# Patient Record
Sex: Female | Born: 1995 | Race: White | Hispanic: No | Marital: Single | Smoking: Never smoker
Health system: Southern US, Community
[De-identification: ages and names within clinical notes are randomized; demographics above are authoritative.]

## PROBLEM LIST (undated history)

## (undated) DIAGNOSIS — N83209 Unspecified ovarian cyst, unspecified side: Secondary | ICD-10-CM

---

## 2017-01-28 ENCOUNTER — Emergency Department (HOSPITAL_BASED_OUTPATIENT_CLINIC_OR_DEPARTMENT_OTHER)
Admission: EM | Admit: 2017-01-28 | Discharge: 2017-01-29 | Disposition: A | Discharge status: Home / Self Care | Payer: BLUE CROSS/BLUE SHIELD | Attending: Emergency Medicine | Admitting: Emergency Medicine

## 2017-01-28 ENCOUNTER — Emergency Department (HOSPITAL_BASED_OUTPATIENT_CLINIC_OR_DEPARTMENT_OTHER): Payer: BLUE CROSS/BLUE SHIELD

## 2017-01-28 ENCOUNTER — Encounter (HOSPITAL_BASED_OUTPATIENT_CLINIC_OR_DEPARTMENT_OTHER): Payer: Self-pay

## 2017-01-28 DIAGNOSIS — R1032 Left lower quadrant pain: Secondary | ICD-10-CM | POA: Diagnosis present

## 2017-01-28 DIAGNOSIS — K5909 Other constipation: Secondary | ICD-10-CM | POA: Diagnosis not present

## 2017-01-28 DIAGNOSIS — N760 Acute vaginitis: Secondary | ICD-10-CM

## 2017-01-28 DIAGNOSIS — B9689 Other specified bacterial agents as the cause of diseases classified elsewhere: Secondary | ICD-10-CM

## 2017-01-28 DIAGNOSIS — Z202 Contact with and (suspected) exposure to infections with a predominantly sexual mode of transmission: Secondary | ICD-10-CM | POA: Insufficient documentation

## 2017-01-28 DIAGNOSIS — Z711 Person with feared health complaint in whom no diagnosis is made: Secondary | ICD-10-CM

## 2017-01-28 DIAGNOSIS — R110 Nausea: Secondary | ICD-10-CM | POA: Diagnosis not present

## 2017-01-28 HISTORY — DX: Unspecified ovarian cyst, unspecified side: N83.209

## 2017-01-28 LAB — URINALYSIS, ROUTINE W REFLEX MICROSCOPIC
Bilirubin Urine: NEGATIVE
GLUCOSE, UA: NEGATIVE mg/dL
Ketones, ur: NEGATIVE mg/dL
LEUKOCYTES UA: NEGATIVE
Nitrite: NEGATIVE
PH: 7 (ref 5.0–8.0)
Protein, ur: NEGATIVE mg/dL
SPECIFIC GRAVITY, URINE: 1.006 (ref 1.005–1.030)

## 2017-01-28 LAB — CBC WITH DIFFERENTIAL/PLATELET
BASOS ABS: 0 10*3/uL (ref 0.0–0.1)
Basophils Relative: 0 %
EOS PCT: 2 %
Eosinophils Absolute: 0.2 10*3/uL (ref 0.0–0.7)
HEMATOCRIT: 37.3 % (ref 36.0–46.0)
Hemoglobin: 12.7 g/dL (ref 12.0–15.0)
Lymphocytes Relative: 44 %
Lymphs Abs: 3.5 10*3/uL (ref 0.7–4.0)
MCH: 31.2 pg (ref 26.0–34.0)
MCHC: 34 g/dL (ref 30.0–36.0)
MCV: 91.6 fL (ref 78.0–100.0)
Monocytes Absolute: 0.6 10*3/uL (ref 0.1–1.0)
Monocytes Relative: 7 %
NEUTROS ABS: 3.7 10*3/uL (ref 1.7–7.7)
Neutrophils Relative %: 47 %
Platelets: 325 10*3/uL (ref 150–400)
RBC: 4.07 MIL/uL (ref 3.87–5.11)
RDW: 12.3 % (ref 11.5–15.5)
WBC: 7.9 10*3/uL (ref 4.0–10.5)

## 2017-01-28 LAB — BASIC METABOLIC PANEL
Anion gap: 7 (ref 5–15)
BUN: 10 mg/dL (ref 6–20)
CALCIUM: 9.4 mg/dL (ref 8.9–10.3)
CO2: 26 mmol/L (ref 22–32)
Chloride: 105 mmol/L (ref 101–111)
Creatinine, Ser: 0.75 mg/dL (ref 0.44–1.00)
Glucose, Bld: 120 mg/dL — ABNORMAL HIGH (ref 65–99)
POTASSIUM: 3.4 mmol/L — AB (ref 3.5–5.1)
Sodium: 138 mmol/L (ref 135–145)

## 2017-01-28 LAB — URINALYSIS, MICROSCOPIC (REFLEX): WBC, UA: NONE SEEN WBC/hpf (ref 0–5)

## 2017-01-28 LAB — PREGNANCY, URINE: PREG TEST UR: NEGATIVE

## 2017-01-28 MED ORDER — MORPHINE SULFATE (PF) 4 MG/ML IV SOLN
4.0000 mg | Freq: Once | INTRAVENOUS | Status: AC
  Administered 2017-01-28: 4 mg via INTRAVENOUS
  Filled 2017-01-28: qty 1

## 2017-01-28 MED ORDER — ONDANSETRON HCL 4 MG/2ML IJ SOLN
4.0000 mg | Freq: Once | INTRAMUSCULAR | Status: AC
  Administered 2017-01-28: 4 mg via INTRAVENOUS
  Filled 2017-01-28: qty 2

## 2017-01-28 MED ORDER — SODIUM CHLORIDE 0.9 % IV BOLUS (SEPSIS)
1000.0000 mL | Freq: Once | INTRAVENOUS | Status: AC
  Administered 2017-01-28: 1000 mL via INTRAVENOUS

## 2017-01-28 NOTE — ED Triage Notes (Signed)
C/o LLQ and flank pain x 2 days-states feels like ovarian cyst

## 2017-01-28 NOTE — ED Notes (Signed)
Patient transported to CT 

## 2017-01-28 NOTE — ED Notes (Signed)
Pt c/o left lower abdominal pain that started two days ago with left flank pain that started today.  Pt also c/o nausea.  States she has some vaginal spotting, no discharge and denies dysuria.  Pt took 800mg  ibuprofen this morning.

## 2017-01-28 NOTE — ED Provider Notes (Signed)
[facility] Note   CSN: [id number] Arrival date & time: 01/28/17  2110 By signing my name below, I, [name], attest that this documentation has been prepared under the direction and in the presence of Everlene Farrier, PA-C. Electronically Signed: [name], ED Scribe. 01/28/17. 10:06 PM.  History   Chief Complaint Chief Complaint  Patient presents with  . Abdominal Pain    HPI The history is provided by the patient. No language interpreter was used.   HPI Comments: Carmen Bryant is a 21 y.o. female with LMP 01/10/17 and h/o ovarian cysts, who presents to the Emergency Department complaining of waxing and waning, left lower abdominal pain beginning two days ago with associated left flank, lower back pain and nausea that began earlier today. She reports her pain is colicky in nature and fluctuates in intensity greatly without warning or obvious reason. She also notes she's had some vaginal spotting but states this may be associated with her IUD which was placed in August 2017. Pt reports normal bowel movements but notes she has had decreased urine output today. She has taken 800mg  Ibuprofen this morning. She has h/o ovarian cysts and states this pain feels similar.  Pt states she has unprotected sexual intercourse with one partner, they have both been recently tested for STDs. Denies h/o kidney stones. Pt further denies difficulty urinating, hematuria, fever, chills, vaginal discharge, dysuria, vomiting, diarrhea, or any other associated symptoms.   Past Medical History:  Diagnosis Date  . Ovarian cyst     There are no active problems to display for this patient.   History reviewed. No pertinent surgical history.  OB History    No data available       Home Medications    Prior to Admission medications   Medication Sig Start Date End Date Taking? Authorizing Provider  metroNIDAZOLE (FLAGYL) 500 MG tablet Take 1 tablet (500 mg total) by mouth 2 (two)  times daily. 01/29/17   Everlene Farrier, PA-C  naproxen (NAPROSYN) 250 MG tablet Take 1 tablet (250 mg total) by mouth 2 (two) times daily with a meal. 01/29/17   Everlene Farrier, PA-C  ondansetron (ZOFRAN ODT) 4 MG disintegrating tablet Take 1 tablet (4 mg total) by mouth every 8 (eight) hours as needed for nausea or vomiting. 01/29/17   Everlene Farrier, PA-C  polyethylene glycol powder (GLYCOLAX/MIRALAX) powder Take 17 g by mouth 2 (two) times daily. 01/29/17   Everlene Farrier, PA-C    Family History No family history on file.  Social History Social History  Substance Use Topics  . Smoking status: Never Smoker  . Smokeless tobacco: Never Used  . Alcohol use No     Allergies   Patient has no known allergies.   Review of Systems Review of Systems  Constitutional: Negative for chills and fever.  HENT: Negative for congestion and sore throat.   Eyes: Negative for visual disturbance.  Respiratory: Negative for cough and shortness of breath.   Cardiovascular: Negative for chest pain.  Gastrointestinal: Positive for abdominal pain and nausea. Negative for blood in stool, diarrhea and vomiting.  Genitourinary: Positive for decreased urine volume, flank pain and vaginal bleeding. Negative for difficulty urinating, dysuria, hematuria and vaginal discharge.  Musculoskeletal: Positive for back pain. Negative for neck pain.  Skin: Negative for rash.  Neurological: Negative for light-headedness and headaches.     Physical Exam Updated Vital Signs BP 118/69 (BP Location: Right Arm)   Pulse 80   Temp 98.3 F (36.8 C) (  Oral)   Resp 16   Ht 5\' 5"  (1.651 m)   Wt 63.5 kg   SpO2 100%   BMI 23.30 kg/m   Physical Exam  Constitutional: She is oriented to person, place, and time. She appears well-developed and well-nourished. No distress.  Nontoxic appearing. Appears uncomfortable.  HENT:  Head: Normocephalic and atraumatic.  Mouth/Throat: Oropharynx is clear and moist.  Eyes: Conjunctivae  are normal. Pupils are equal, round, and reactive to light. Right eye exhibits no discharge. Left eye exhibits no discharge.  Neck: Neck supple.  Cardiovascular: Normal rate, regular rhythm, normal heart sounds and intact distal pulses.  Exam reveals no gallop and no friction rub.   No murmur heard. Pulmonary/Chest: Effort normal and breath sounds normal. No respiratory distress. She has no wheezes. She has no rales.  Abdominal: Soft. Bowel sounds are normal. She exhibits no distension and no mass. There is tenderness. There is no rebound and no guarding.  Abdomen is soft, bowel sounds are present. Suprapubic, LLQ, and left flank tenderness to palpation. No peritoneal signs.  Genitourinary: Vaginal discharge found.  Genitourinary Comments: Pelvic exam with female PA student as chaperone. Patient has mild amount of vaginal discharge. Cervix is closed. No cervical motion tenderness. She has some mild left adnexal tenderness to palpation that extends into her left upper quadrant. No right adnexal tenderness. No external lesions or rashes noted.  Musculoskeletal: She exhibits no edema.  Lymphadenopathy:    She has no cervical adenopathy.  Neurological: She is alert and oriented to person, place, and time. Coordination normal.  Skin: Skin is warm and dry. Capillary refill takes less than 2 seconds. No rash noted. She is not diaphoretic. No erythema. No pallor.  Psychiatric: She has a normal mood and affect. Her behavior is normal.  Nursing note and vitals reviewed.    ED Treatments / Results  DIAGNOSTIC STUDIES: Oxygen Saturation is 100% on RA, normal by my interpretation.    COORDINATION OF CARE: 10:06 PM Discussed treatment plan with pt at bedside which includes renal CT and pt agreed to plan.  Labs (all labs ordered are listed, but only abnormal results are displayed) Labs Reviewed  WET PREP, GENITAL - Abnormal; Notable for the following:       Result Value   Clue Cells Wet Prep HPF POC  PRESENT (*)    WBC, Wet Prep HPF POC FEW (*)    All other components within normal limits  URINALYSIS, ROUTINE W REFLEX MICROSCOPIC - Abnormal; Notable for the following:    Hgb urine dipstick LARGE (*)    All other components within normal limits  URINALYSIS, MICROSCOPIC (REFLEX) - Abnormal; Notable for the following:    Bacteria, UA RARE (*)    Squamous Epithelial / LPF 0-5 (*)    All other components within normal limits  BASIC METABOLIC PANEL - Abnormal; Notable for the following:    Potassium 3.4 (*)    Glucose, Bld 120 (*)    All other components within normal limits  PREGNANCY, URINE  CBC WITH DIFFERENTIAL/PLATELET  GC/CHLAMYDIA PROBE AMP ([place]) NOT AT [facility]    EKG  EKG Interpretation None       Radiology Ct Renal Stone Study  Result Date: 01/28/2017 CLINICAL DATA:  Initial evaluation for acute left lower abdominal pain, left flank pain. EXAM: CT ABDOMEN AND PELVIS WITHOUT CONTRAST TECHNIQUE: Multidetector CT imaging of the abdomen and pelvis was performed following the standard protocol without IV contrast. COMPARISON:  None available. FINDINGS: Lower chest:  Visualized lung bases are clear. Hepatobiliary: Liver demonstrates a normal unenhanced appearance. Gallbladder within normal limits. No biliary dilatation. Pancreas: Pancreas within normal limits. Spleen: Spleen within normal limits. Adrenals/Urinary Tract: Adrenal glands are normal. Kidneys equal in size. No nephrolithiasis or hydronephrosis. No radiopaque calculi seen along the course of either renal collecting system. No hydroureter. Bladder within normal limits. No layering stones within the bladder lumen. Stomach/Bowel: Stomach within normal limits. No evidence for bowel obstruction. Appendix within normal limits. No acute inflammatory changes seen about the bowels. Moderate amount of retained stool diffusely throughout the colon, which may reflect constipation. Vascular/Lymphatic: Intra-abdominal aorta of normal  caliber. No adenopathy. Reproductive: IUD in place within the uterus. Uterus and ovaries otherwise unremarkable. Other: No free air or fluid. Musculoskeletal: No acute osseous abnormality. No worrisome lytic or blastic osseous lesions. IMPRESSION: 1. No CT evidence for nephrolithiasis or obstructive uropathy. 2. No other acute intra-abdominal or pelvic process identified. 3. Moderate amount retained stool diffusely throughout the colon, which may reflect constipation. Electronically Signed   By: Rise Mu M.D.   On: 01/28/2017 22:56    Procedures Procedures (including critical care time)  Medications Ordered in ED Medications  lidocaine (PF) (XYLOCAINE) 1 % injection (not administered)  sodium chloride 0.9 % bolus 1,000 mL (0 mLs Intravenous Stopped 01/29/17 0041)  ondansetron (ZOFRAN) injection 4 mg (4 mg Intravenous Given 01/28/17 2216)  morphine 4 MG/ML injection 4 mg (4 mg Intravenous Given 01/28/17 2218)  ketorolac (TORADOL) 30 MG/ML injection 30 mg (30 mg Intravenous Given 01/29/17 0026)  ondansetron (ZOFRAN) injection 4 mg (4 mg Intravenous Given 01/29/17 0043)  cefTRIAXone (ROCEPHIN) injection 250 mg (250 mg Intramuscular Given 01/29/17 0103)  azithromycin (ZITHROMAX) tablet 1,000 mg (1,000 mg Oral Given 01/29/17 0102)  ketorolac (TORADOL) 30 MG/ML injection (30 mg  Given 01/29/17 0043)     Initial Impression / Assessment and Plan / ED Course  I have reviewed the triage vital signs and the nursing notes.  Pertinent labs & imaging results that were available during my care of the patient were reviewed by me and considered in my medical decision making (see chart for details).    This  is a 21 y.o. female with LMP 01/10/17 and h/o ovarian cysts, who presents to the Emergency Department complaining of waxing and waning, left lower abdominal pain beginning two days ago with associated left flank, lower back pain and nausea that began earlier today. She reports her pain is colicky in  nature and fluctuates in intensity greatly without warning or obvious reason. She also notes she's had some vaginal spotting but states this may be associated with her IUD which was placed in August 2017. Pt reports normal bowel movements but notes she has had decreased urine output today. She has taken 800mg  Ibuprofen this morning. She has h/o ovarian cysts and states this pain feels similar.  Pt states she has unprotected sexual intercourse with one partner, they have both been recently tested for STDs. Denies h/o kidney stones. On exam the patient is afebrile and nontoxic appearing. She appears uncomfortable. Her abdomen is soft and she has tenderness to her left lower quadrant and left flank. Dense history and exam seems consistent with a likely renal stone. Will obtain blood work and CT renal stone study.  Urine pregnancy test is negative. Urinalysis is remarkable only for large hemoglobin. BMP is unremarkable. CBC shows no leukocytosis.  CT renal stone study shows no evidence for kidney stone or obstructive uropathy. There is no other  acute intra-abdominal or pelvic process identified. Normal ovaries bilaterally. There is a moderate amount of retained stool diffusely throughout the colon.  At reevaluation patient reports she is feeling better but her pain is not completely resolved. Performed a pelvic exam which showed some left lower adnexal tenderness that moves up to her left lower quadrant. She is a mild amount of vaginal discharge. No cervical motion tenderness. No right adnexal tenderness.  Concern for possible ovarian process. I consulted with attending physician at the maternity admission unit at [facility] Dr. Despina HiddenEure who discussed this patient with me. I discussed wanting to do a pelvic ultrasound to rule out torsion. Dr. Despina HiddenEure reports as the patient has normal ovaries on CT scan she would not have ovarian torsion and there is no need for pelvic ultrasound at this time. This seems  reasonable. Discussed this with the patient and she agrees.  (Is remarkable for clue cells and few white blood cells. As the patient is a recent unprotected intercourse will treat empirically with Rocephin and azithromycin for gonorrhea and chlamydia. I advised that she is testing STD test results. Also will treat for BV with Flagyl. I encouraged her to follow-up with OB discussed strict and specific return precautions. MiraLAX for constipation. Naproxen and Zofran for pain and nausea as needed. I advised the patient to follow-up with their primary care provider this week. I advised the patient to return to the emergency department with new or worsening symptoms or new concerns. The patient verbalized understanding and agreement with plan.    This patient was discussed with Dr. Ranae PalmsYelverton who agrees with assessment and plan.   Final Clinical Impressions(s) / ED Diagnoses   Final diagnoses:  LLQ abdominal pain  Concern about STD in female without diagnosis  Other constipation  Nausea  BV (bacterial vaginosis)    New Prescriptions New Prescriptions   METRONIDAZOLE (FLAGYL) 500 MG TABLET    Take 1 tablet (500 mg total) by mouth 2 (two) times daily.   NAPROXEN (NAPROSYN) 250 MG TABLET    Take 1 tablet (250 mg total) by mouth 2 (two) times daily with a meal.   ONDANSETRON (ZOFRAN ODT) 4 MG DISINTEGRATING TABLET    Take 1 tablet (4 mg total) by mouth every 8 (eight) hours as needed for nausea or vomiting.   POLYETHYLENE GLYCOL POWDER (GLYCOLAX/MIRALAX) POWDER    Take 17 g by mouth 2 (two) times daily.   I personally performed the services described in this documentation, which was scribed in my presence. The recorded information has been reviewed and is accurate.      Everlene FarrierWilliam Dreux Mcgroarty, PA-C 01/29/17 0118    Loren Raceravid Yelverton, MD 01/30/17 [id number]

## 2017-01-29 LAB — WET PREP, GENITAL
Sperm: NONE SEEN
TRICH WET PREP: NONE SEEN
Yeast Wet Prep HPF POC: NONE SEEN

## 2017-01-29 LAB — GC/CHLAMYDIA PROBE AMP (~~LOC~~) NOT AT ARMC
Chlamydia: POSITIVE — AB
NEISSERIA GONORRHEA: NEGATIVE

## 2017-01-29 MED ORDER — AZITHROMYCIN 250 MG PO TABS
1000.0000 mg | ORAL_TABLET | Freq: Once | ORAL | Status: AC
  Administered 2017-01-29: 1000 mg via ORAL
  Filled 2017-01-29: qty 4

## 2017-01-29 MED ORDER — LIDOCAINE HCL (PF) 1 % IJ SOLN
INTRAMUSCULAR | Status: AC
  Filled 2017-01-29: qty 5

## 2017-01-29 MED ORDER — NAPROXEN 250 MG PO TABS: 250.0000 mg | ORAL_TABLET | Freq: Two times a day (BID) | ORAL | 0 refills | Status: AC

## 2017-01-29 MED ORDER — ONDANSETRON HCL 4 MG/2ML IJ SOLN
4.0000 mg | Freq: Once | INTRAMUSCULAR | Status: AC
  Administered 2017-01-29: 4 mg via INTRAVENOUS
  Filled 2017-01-29: qty 2

## 2017-01-29 MED ORDER — CEFTRIAXONE SODIUM 250 MG IJ SOLR
250.0000 mg | Freq: Once | INTRAMUSCULAR | Status: AC
  Administered 2017-01-29: 250 mg via INTRAMUSCULAR
  Filled 2017-01-29: qty 250

## 2017-01-29 MED ORDER — METRONIDAZOLE 500 MG PO TABS: 500.0000 mg | ORAL_TABLET | Freq: Two times a day (BID) | ORAL | 0 refills | Status: AC

## 2017-01-29 MED ORDER — KETOROLAC TROMETHAMINE 30 MG/ML IJ SOLN
30.0000 mg | Freq: Once | INTRAMUSCULAR | Status: AC
  Administered 2017-01-29: 30 mg via INTRAVENOUS
  Filled 2017-01-29: qty 1

## 2017-01-29 MED ORDER — POLYETHYLENE GLYCOL 3350 17 GM/SCOOP PO POWD: 17.0000 g | Freq: Two times a day (BID) | ORAL | 0 refills | Status: AC

## 2017-01-29 MED ORDER — KETOROLAC TROMETHAMINE 30 MG/ML IJ SOLN
INTRAMUSCULAR | Status: AC
  Administered 2017-01-29: 30 mg
  Filled 2017-01-29: qty 1

## 2017-01-29 MED ORDER — ONDANSETRON 4 MG PO TBDP: 4.0000 mg | ORAL_TABLET | Freq: Three times a day (TID) | ORAL | 0 refills | Status: AC | PRN

## 2017-01-29 NOTE — ED Notes (Signed)
Pt verbalizes understanding of d/c instructions and denies any further needs at this time. 

## 2017-01-29 NOTE — ED Notes (Signed)
ED Provider at bedside. 

## 2017-10-05 ENCOUNTER — Encounter (HOSPITAL_BASED_OUTPATIENT_CLINIC_OR_DEPARTMENT_OTHER): Payer: Self-pay

## 2017-10-05 ENCOUNTER — Other Ambulatory Visit: Payer: Self-pay

## 2017-10-05 DIAGNOSIS — Z5321 Procedure and treatment not carried out due to patient leaving prior to being seen by health care provider: Secondary | ICD-10-CM | POA: Insufficient documentation

## 2017-10-05 DIAGNOSIS — T7840XA Allergy, unspecified, initial encounter: Secondary | ICD-10-CM | POA: Diagnosis present

## 2017-10-05 MED ORDER — DIPHENHYDRAMINE HCL 25 MG PO CAPS
25.0000 mg | ORAL_CAPSULE | Freq: Once | ORAL | Status: AC
  Administered 2017-10-05: 25 mg via ORAL
  Filled 2017-10-05: qty 1

## 2017-10-05 MED ORDER — FAMOTIDINE 20 MG PO TABS
20.0000 mg | ORAL_TABLET | Freq: Once | ORAL | Status: AC
  Administered 2017-10-05: 20 mg via ORAL
  Filled 2017-10-05: qty 1

## 2017-10-05 NOTE — ED Triage Notes (Signed)
Pt c/o "allergic reaction" since this morning, started with a rash, now states her face and lips are swollen, this is not appreciated on exam, pt took 50mg  benadryl at noon and another 25mg  at 2200

## 2017-10-06 ENCOUNTER — Emergency Department (HOSPITAL_BASED_OUTPATIENT_CLINIC_OR_DEPARTMENT_OTHER)
Admission: EM | Admit: 2017-10-06 | Discharge: 2017-10-06 | Disposition: A | Discharge status: Home / Self Care | Payer: BLUE CROSS/BLUE SHIELD | Attending: Emergency Medicine | Admitting: Emergency Medicine

## 2017-10-06 NOTE — ED Notes (Signed)
Pt and her friends walked out of her room and out of the ER. No distress noted

## 2017-10-06 NOTE — ED Notes (Signed)
Pt states she is very irritated due to the wait, states "I know what I need, that's why I'm here" pt notified the process of the ER. Pt was given protocol meds in triage. Pt states it didn't help. Pt speaking in complete sentences, no distress noted. Pt has a rash to her arms and swelling under eyes; pt states she is impatient and can't wait any longer. I asked her to wait a few more minutes.

## 2018-11-16 IMAGING — CT CT RENAL STONE PROTOCOL
2 of 4 series · 16 of 46 positions shown, 18 images · non-contrast
Comparison: None available.

CLINICAL DATA: Initial evaluation for acute left lower abdominal
pain, left flank pain.

EXAM:
CT ABDOMEN AND PELVIS WITHOUT CONTRAST
TECHNIQUE: Multidetector CT imaging of the abdomen and pelvis was performed
following the standard protocol without IV contrast.

[Series 2: axial st · axial · 0.89mm/px · z∈[-414,-30]mm · 13 of 85 slices shown, 15 images]
[im 4/85  soft-tissue]
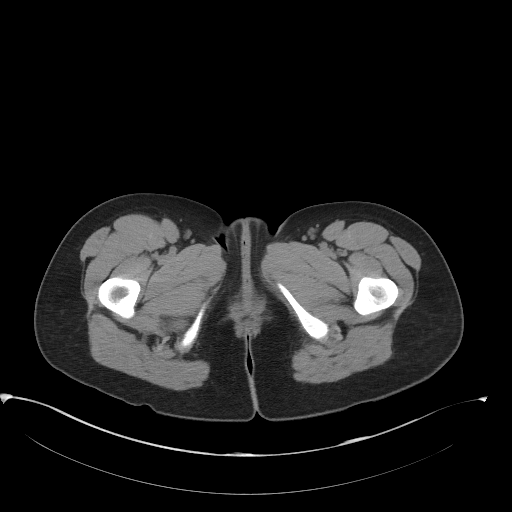
[im 4/85  bone]
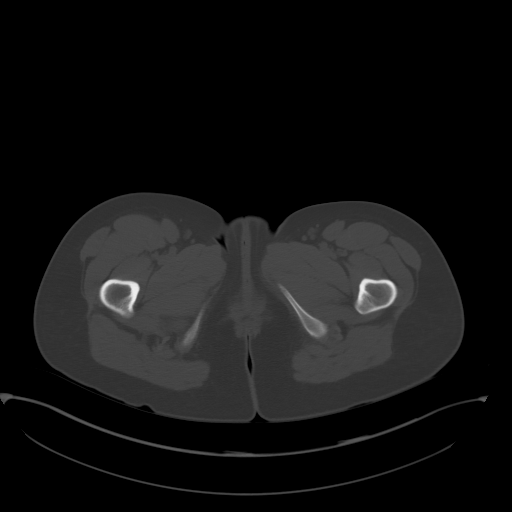
[im 11/85  soft-tissue]
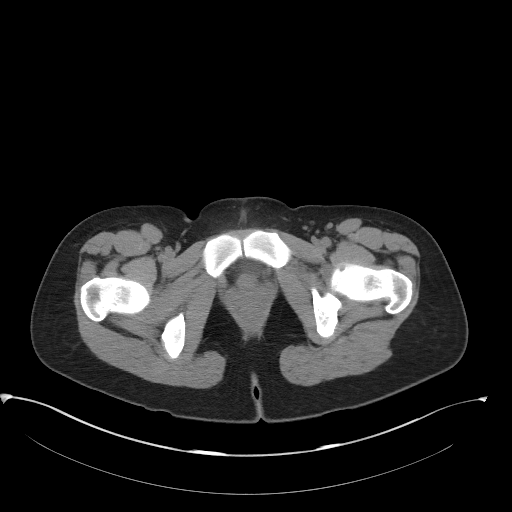
[im 18/85  soft-tissue]
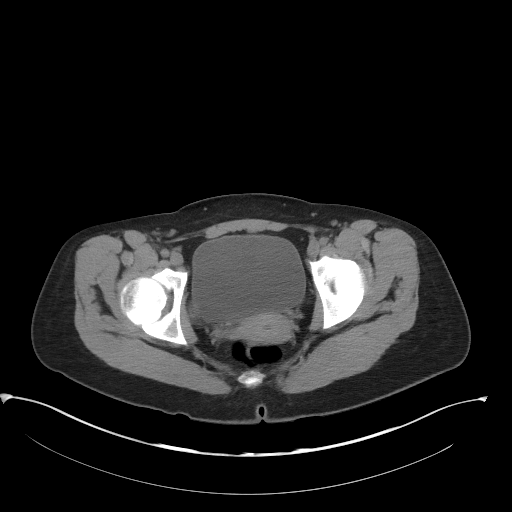
[im 25/85  soft-tissue]
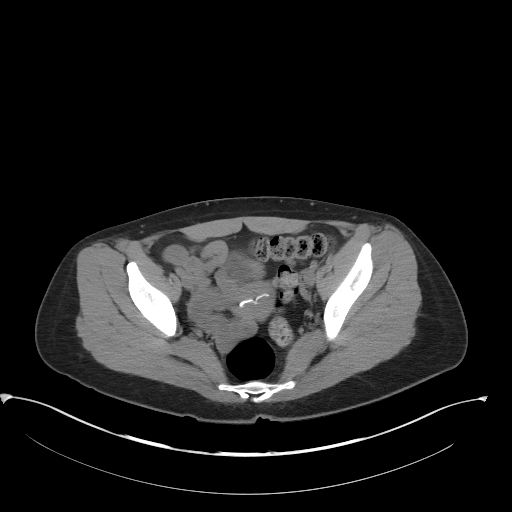
[im 29/85  soft-tissue]
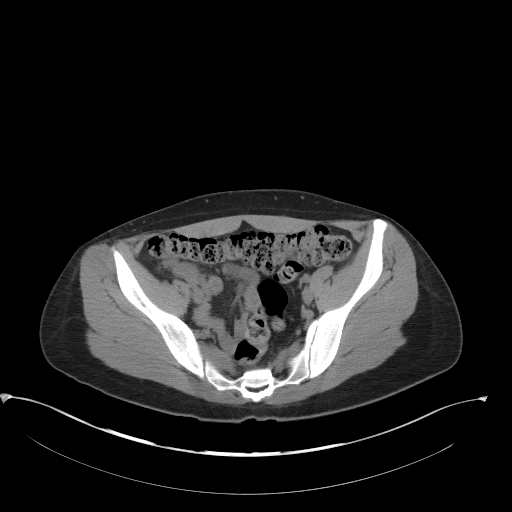
[im 36/85  soft-tissue]
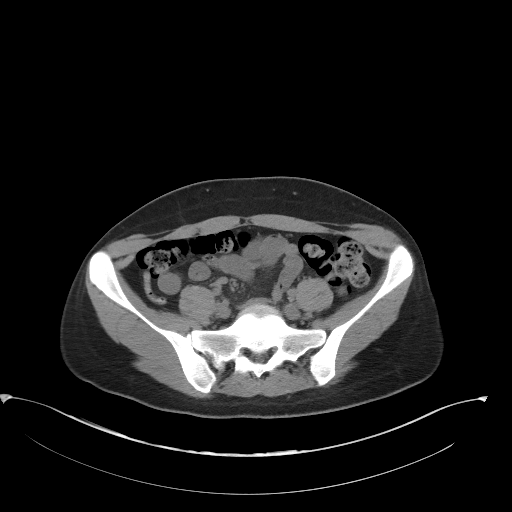
[im 43/85  soft-tissue]
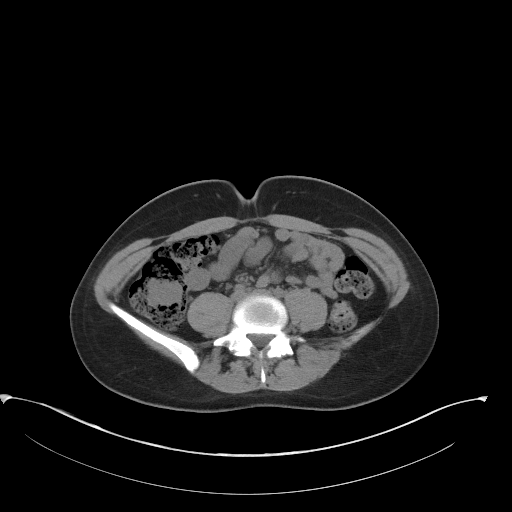
[im 50/85  soft-tissue]
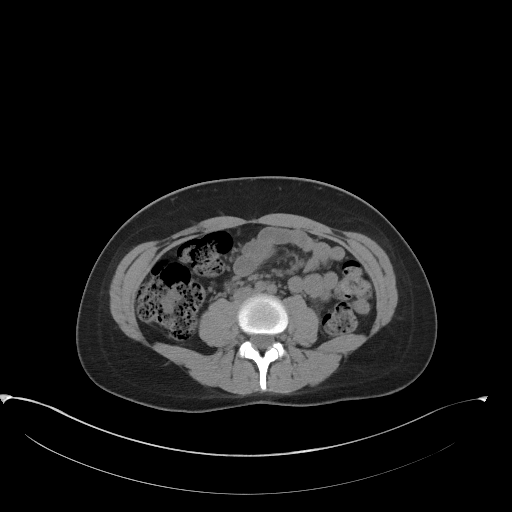
[im 57/85  soft-tissue]
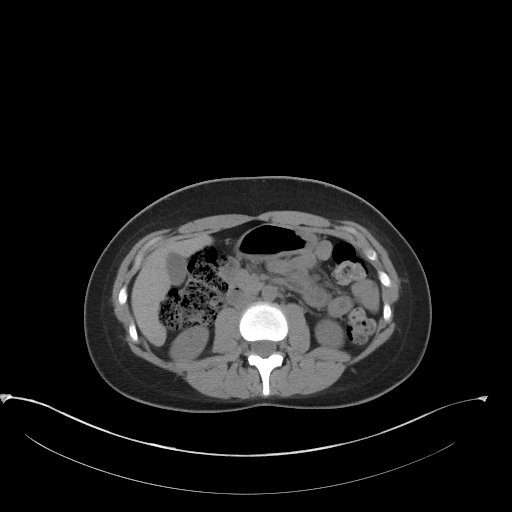
[im 57/85  bone]
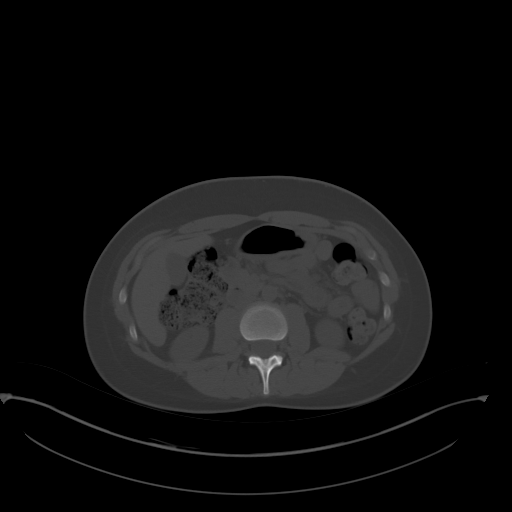
[im 60/85  soft-tissue]
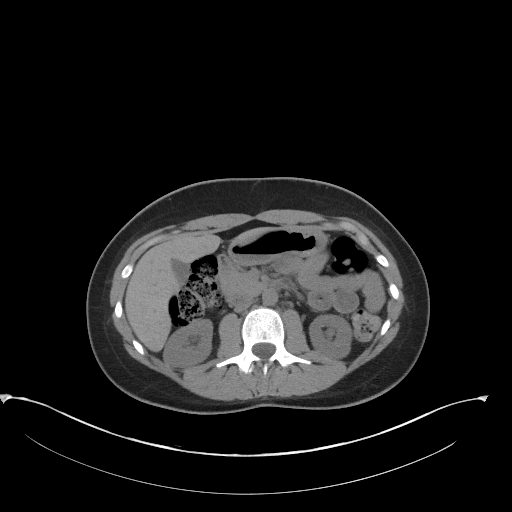
[im 67/85  soft-tissue]
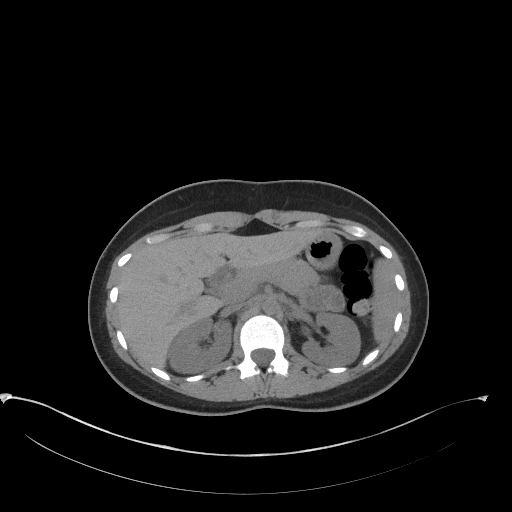
[im 74/85  soft-tissue]
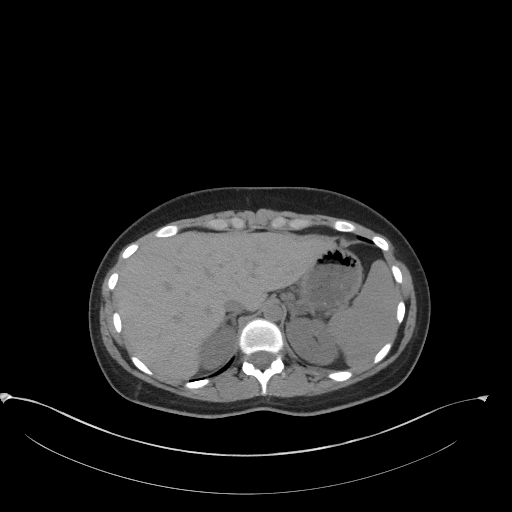
[im 81/85  soft-tissue]
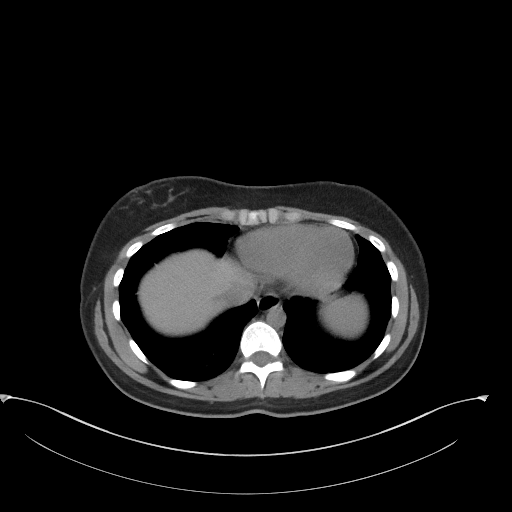

[Series 5: coronal st · coronal · 0.73mm/px · 3 of 73 slices shown]
[im 25/73  soft-tissue]
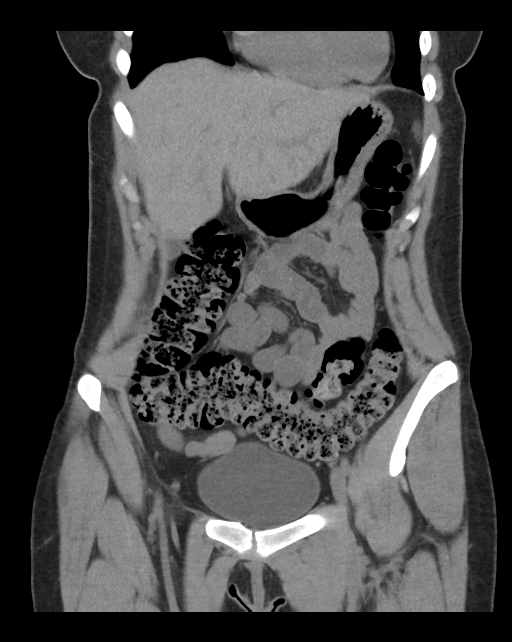
[im 33/73  soft-tissue]
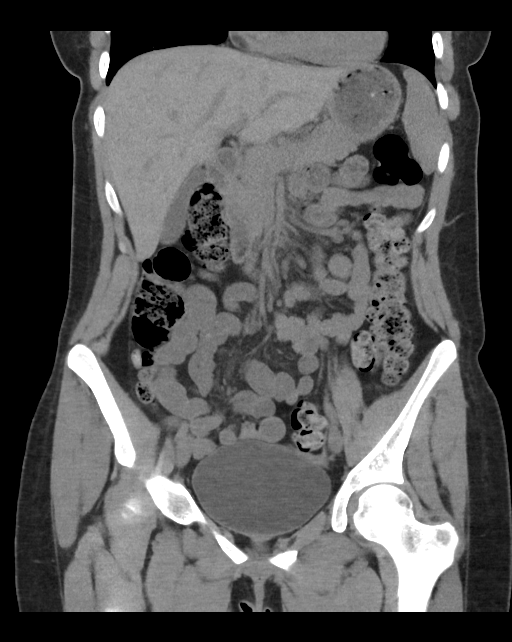
[im 41/73  soft-tissue]
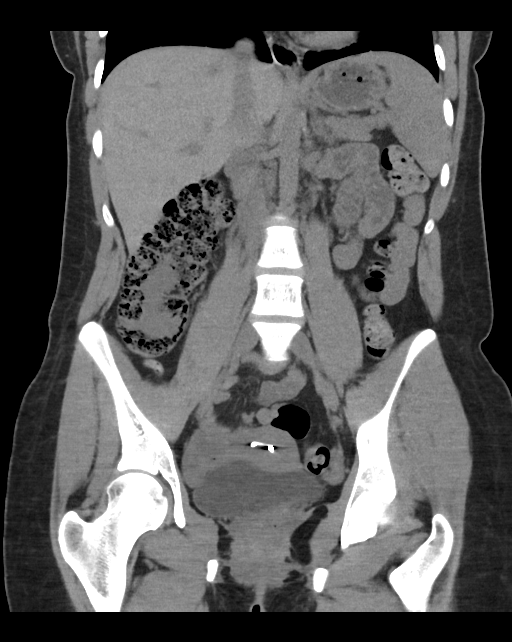

[16 of 46 positions shown; findings below may reference images not displayed]

FINDINGS: Lower chest: Visualized lung bases are clear.

Hepatobiliary: Liver demonstrates a normal unenhanced appearance.
Gallbladder within normal limits. No biliary dilatation.

Pancreas: Pancreas within normal limits.

Spleen: Spleen within normal limits.

Adrenals/Urinary Tract: Adrenal glands are normal.

Kidneys equal in size. No nephrolithiasis or hydronephrosis. No
radiopaque calculi seen along the course of either renal collecting
system. No hydroureter. Bladder within normal limits. No layering
stones within the bladder lumen.

Stomach/Bowel: Stomach within normal limits. No evidence for bowel
obstruction. Appendix within normal limits. No acute inflammatory
changes seen about the bowels. Moderate amount of retained stool
diffusely throughout the colon, which may reflect constipation.

Vascular/Lymphatic: Intra-abdominal aorta of normal caliber. No
adenopathy.

Reproductive: IUD in place within the uterus. Uterus and ovaries
otherwise unremarkable.

Other: No free air or fluid.

Musculoskeletal: No acute osseous abnormality. No worrisome lytic or
blastic osseous lesions.
IMPRESSION: 1. No CT evidence for nephrolithiasis or obstructive uropathy.
2. No other acute intra-abdominal or pelvic process identified.
3. Moderate amount retained stool diffusely throughout the colon,
which may reflect constipation.
